# Patient Record
Sex: Female | Born: 1990 | Race: White | Hispanic: No | Marital: Single
Health system: Southern US, Community
[De-identification: ages and names within clinical notes are randomized; demographics above are authoritative.]

---
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
## 2013-04-17 ENCOUNTER — Other Ambulatory Visit: Payer: Self-pay | Admitting: Nurse Practitioner

## 2013-04-17 DIAGNOSIS — E049 Nontoxic goiter, unspecified: Secondary | ICD-10-CM

## 2013-04-18 ENCOUNTER — Ambulatory Visit
Admission: RE | Admit: 2013-04-18 | Discharge: 2013-04-18 | Disposition: A | Discharge status: Home / Self Care | Payer: BC Managed Care – PPO | Source: Ambulatory Visit | Attending: Nurse Practitioner | Admitting: Nurse Practitioner

## 2013-04-18 DIAGNOSIS — E049 Nontoxic goiter, unspecified: Secondary | ICD-10-CM

## 2015-09-28 IMAGING — US US SOFT TISSUE HEAD/NECK
1 series · 14 of 25 positions shown · non-contrast
Comparison: NM THYROID SCAN/UPTAKE 24 HR dated 11/09/2006

CLINICAL DATA: Thyroid nodules.

EXAM:
THYROID ULTRASOUND
TECHNIQUE: Ultrasound examination of the thyroid gland and adjacent soft
tissues was performed.

[Series 1: us soft tissue head/neck · 0.09mm/px · 14 of 60 slices shown]
[im 1/60]
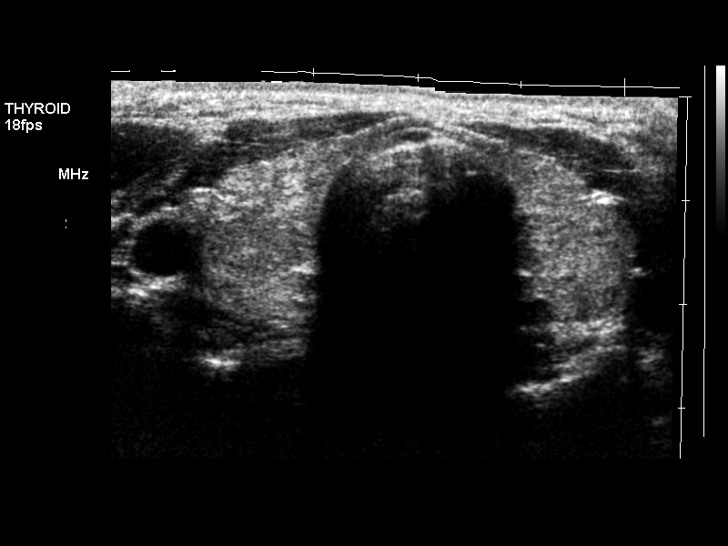
[im 5/60]
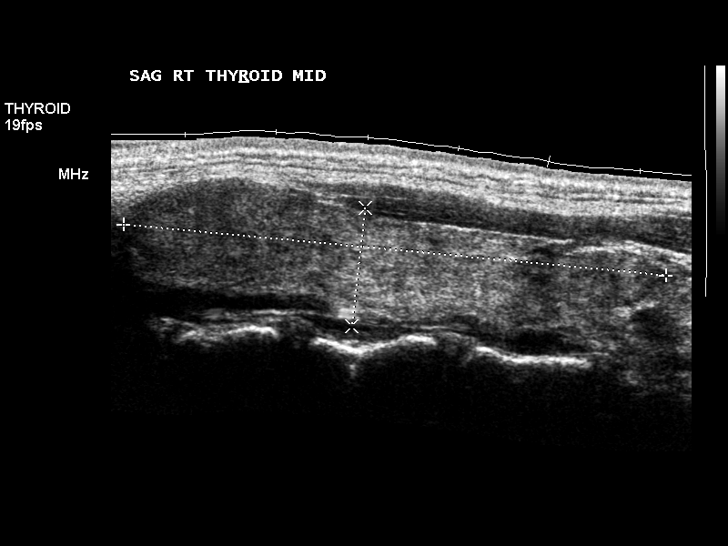
[im 10/60]
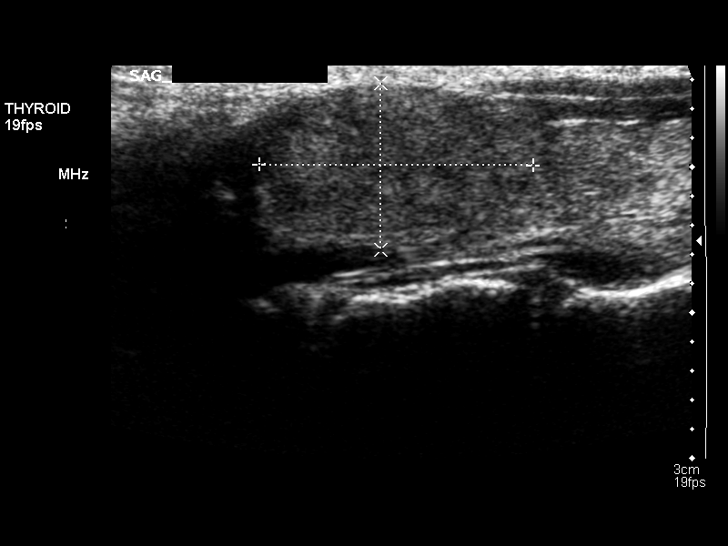
[im 15/60]
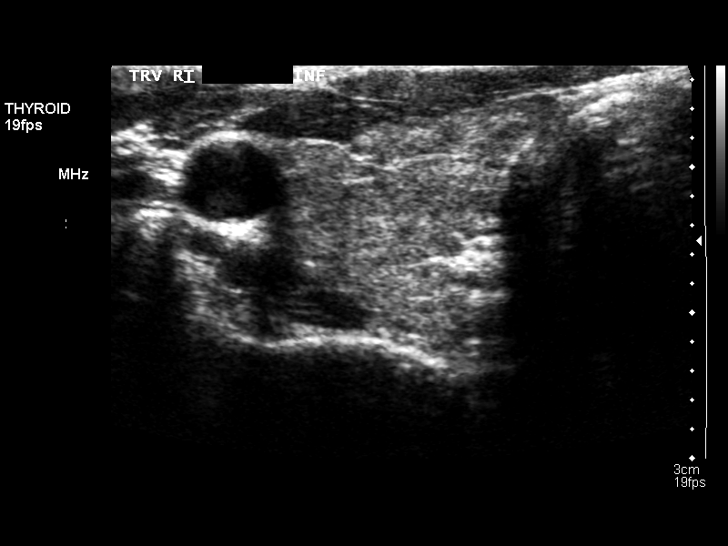
[im 20/60]
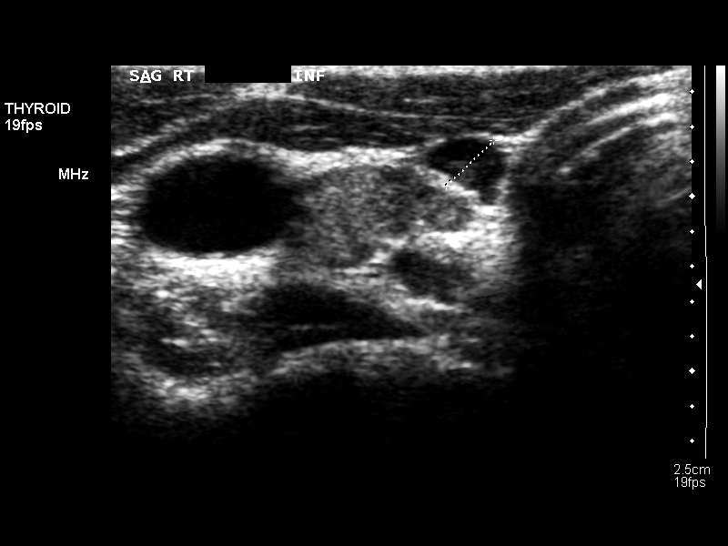
[im 23/60]
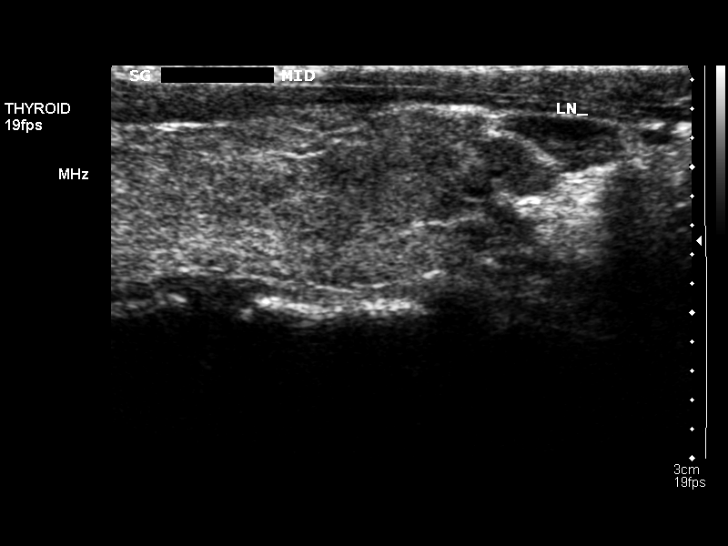
[im 28/60]
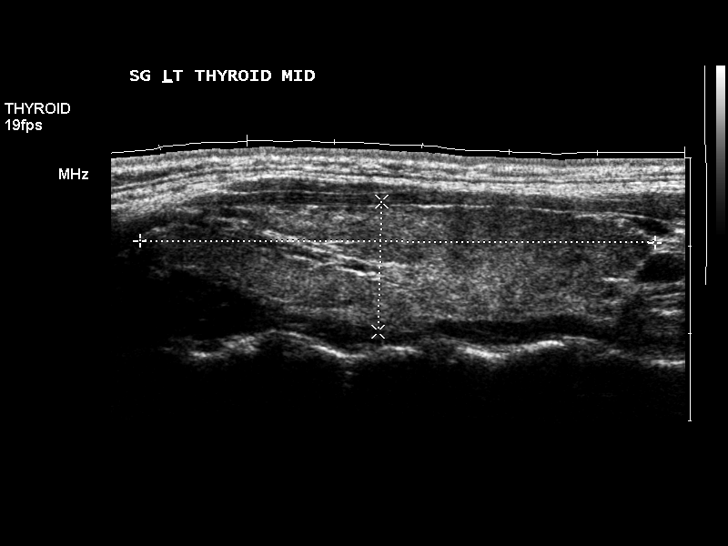
[im 32/60]
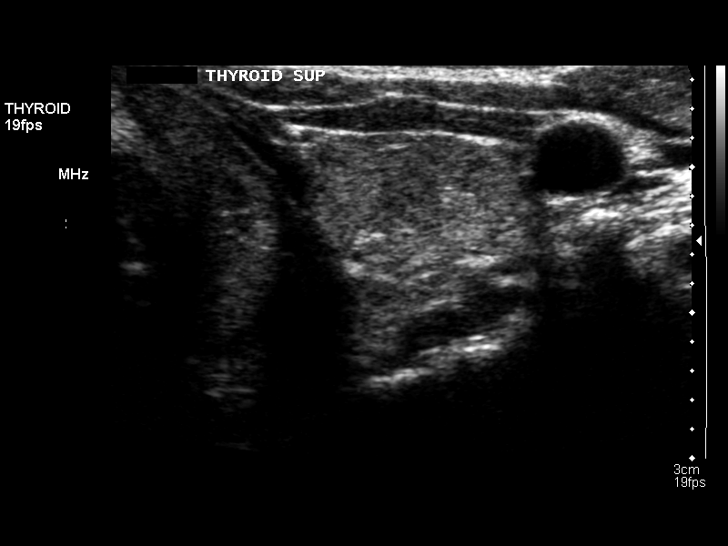
[im 37/60]
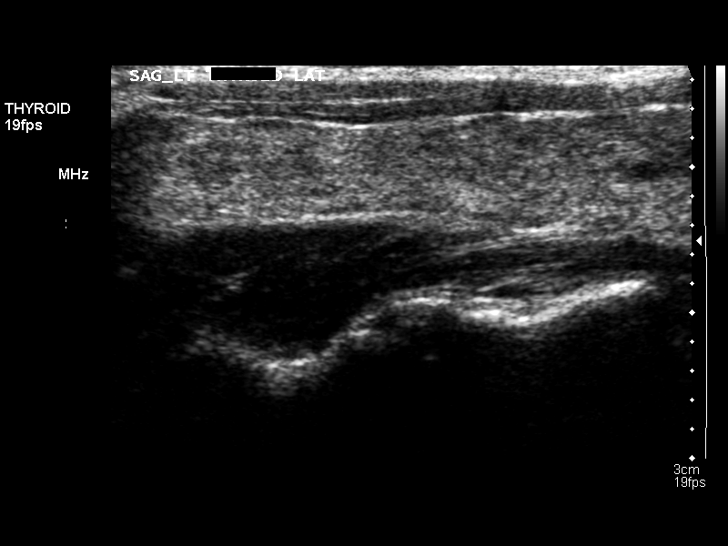
[im 40/60]
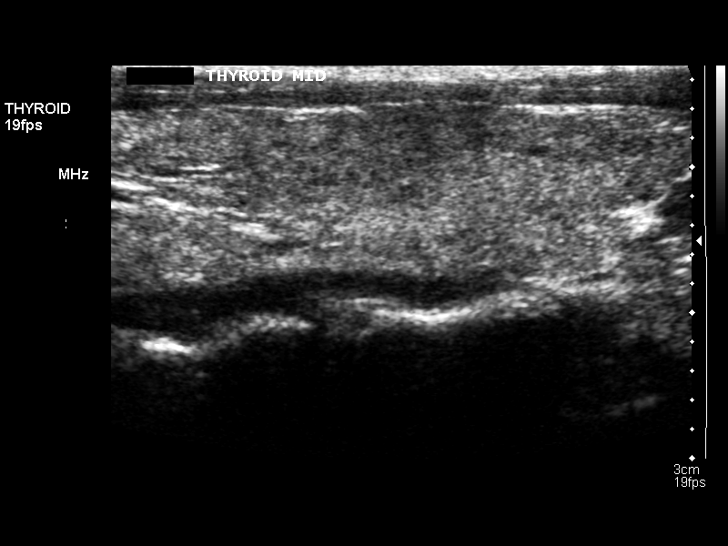
[im 45/60]
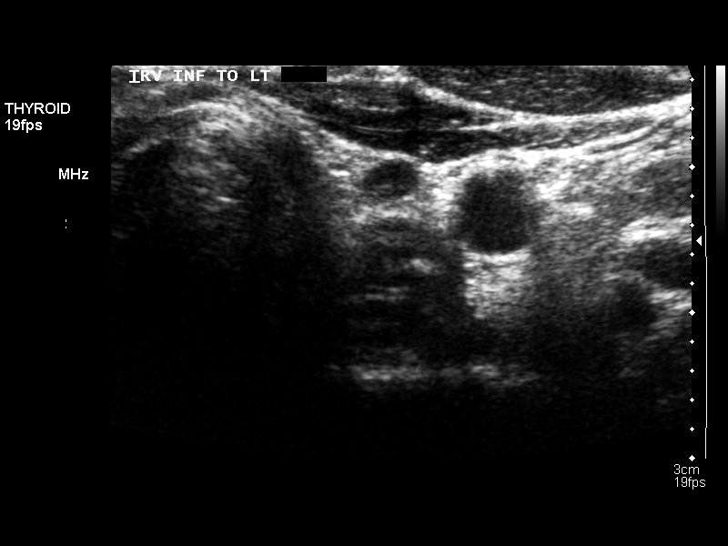
[im 50/60]
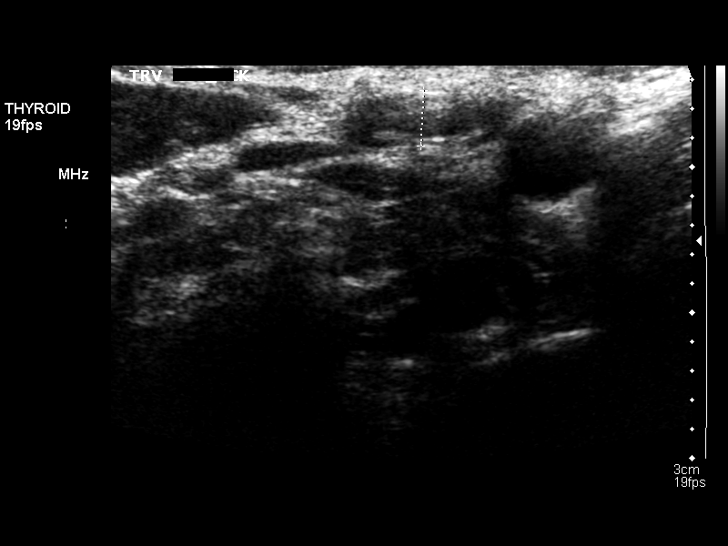
[im 55/60]
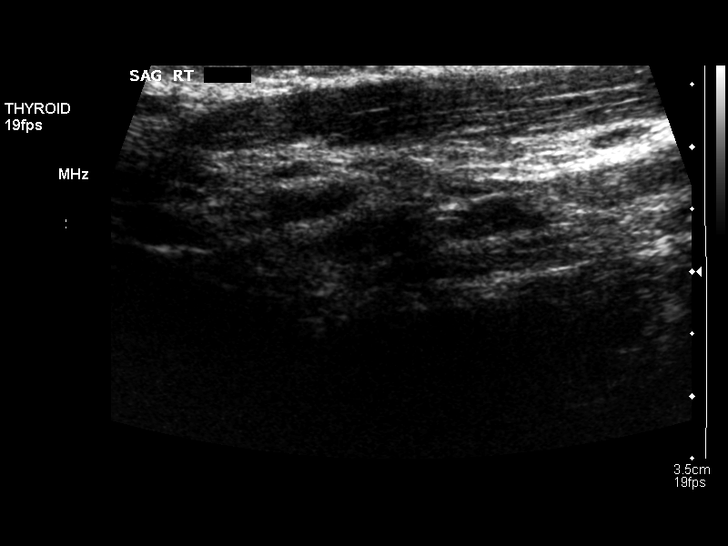
[im 60/60]
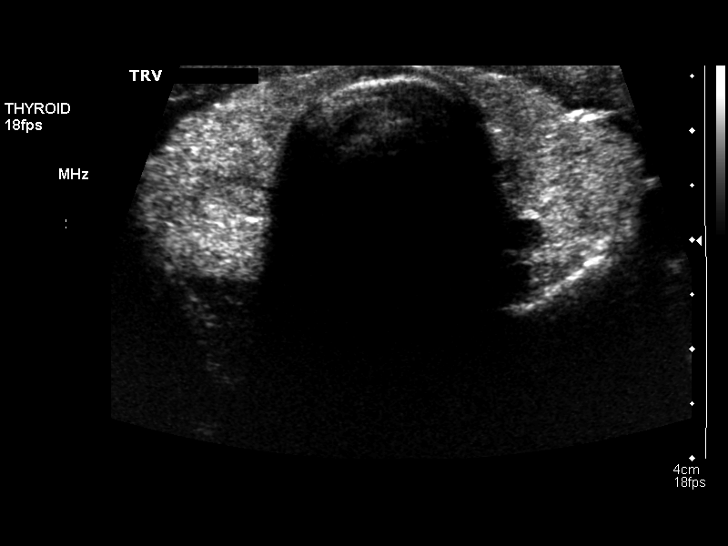

[14 of 25 positions shown; findings below may reference images not displayed]

FINDINGS: Right thyroid lobe

Measurements: 5.9 x 1.3 x 1.7 cm. An area of relative nodularity in
the superior aspect of the right lobe was measured. This is not well
circumscribed and is felt to most likely represent heterogeneous
nodular tissue rather than a discrete true thyroid nodule.

Left thyroid lobe

Measurements: 5.9 x 1.5 x 1.7 cm.  No nodules visualized.

Isthmus

Thickness: 0.2 cm.  No nodules visualized.

Lymphadenopathy

None visualized.
IMPRESSION: No discrete thyroid nodules are identified. An area of relative
nodular heterogeneity in the superior right lobe blends into
adjacent tissue and is felt to represent heterogeneous tissue rather
than a discrete nodule
